# Patient Record
Sex: Female | Born: 1965 | Race: Black or African American | Hispanic: No | Marital: Single | Smoking: Former smoker
Health system: Southern US, Community
[De-identification: ages and names within clinical notes are randomized; demographics above are authoritative.]

## PROBLEM LIST (undated history)

## (undated) DIAGNOSIS — J45909 Unspecified asthma, uncomplicated: Secondary | ICD-10-CM

## (undated) HISTORY — DX: Unspecified asthma, uncomplicated: J45.909

## (undated) HISTORY — PX: TUBAL LIGATION: SHX77

---

## 2018-08-19 ENCOUNTER — Other Ambulatory Visit: Payer: Self-pay | Admitting: Adult Health

## 2018-08-19 ENCOUNTER — Encounter: Payer: Self-pay | Admitting: Adult Health

## 2018-08-19 ENCOUNTER — Encounter (INDEPENDENT_AMBULATORY_CARE_PROVIDER_SITE_OTHER): Payer: Self-pay

## 2018-08-19 ENCOUNTER — Ambulatory Visit (INDEPENDENT_AMBULATORY_CARE_PROVIDER_SITE_OTHER): Payer: Managed Care, Other (non HMO) | Admitting: Adult Health

## 2018-08-19 ENCOUNTER — Other Ambulatory Visit (HOSPITAL_COMMUNITY)
Admission: RE | Admit: 2018-08-19 | Discharge: 2018-08-19 | Disposition: A | Discharge status: Home / Self Care | Payer: Managed Care, Other (non HMO) | Source: Ambulatory Visit | Attending: Adult Health | Admitting: Adult Health

## 2018-08-19 VITALS — BP 107/73 | HR 65 | Ht 67.0 in | Wt 214.0 lb

## 2018-08-19 DIAGNOSIS — Z01419 Encounter for gynecological examination (general) (routine) without abnormal findings: Secondary | ICD-10-CM

## 2018-08-19 DIAGNOSIS — Z1212 Encounter for screening for malignant neoplasm of rectum: Secondary | ICD-10-CM

## 2018-08-19 DIAGNOSIS — R232 Flushing: Secondary | ICD-10-CM

## 2018-08-19 DIAGNOSIS — Z1211 Encounter for screening for malignant neoplasm of colon: Secondary | ICD-10-CM | POA: Insufficient documentation

## 2018-08-19 LAB — HEMOCCULT GUIAC POC 1CARD (OFFICE): Fecal Occult Blood, POC: NEGATIVE

## 2018-08-19 NOTE — Progress Notes (Signed)
Patient ID: Angela Chan, female   DOB: 03-14-1966, 53 y.o.   MRN: [id number] History of Present Illness:  Angela Chan is a 53 year old black female, married in for a well woman gyn exam and pap.She is a new pt.She stopped periods in her 40's and is having hot flashes. She works out 3-4 x weekly.  PCP is Roque Cash FNP  Current Medications, Allergies, Past Medical History, Past Surgical History, Family History and Social History were reviewed in [employer] record.     Review of Systems: Patient denies any headaches, hearing loss, fatigue, blurred vision, shortness of breath, chest pain, abdominal pain, problems with bowel movements(oaccasional constipation), urination, or intercourse(not active). No joint pain or mood swings. +hot flashes    Physical Exam:BP 107/73 (BP Location: Left Arm, Patient Position: Sitting, Cuff Size: Normal)   Pulse 65   Ht 5\' 7"  (1.702 m)   Wt 214 lb (97.1 kg)   BMI 33.52 kg/m  General:  Well developed, well nourished, no acute distress Skin:  Warm and dry Neck:  Midline trachea, normal thyroid, good ROM, no lymphadenopathy Lungs; Clear to auscultation bilaterally Breast:  No dominant palpable mass, retraction, or nipple discharge Cardiovascular: Regular rate and rhythm Abdomen:  Soft, non tender, no hepatosplenomegaly Pelvic:  External genitalia is normal in appearance, no lesions.  The vagina is normal in appearance. Urethra has no lesions or masses. The cervix is bulbous. Pap with HPV performed. Uterus is felt to be normal size, shape, and contour.  No adnexal masses or tenderness noted.Bladder is non tender, no masses felt. Rectal: Good sphincter tone, no polyps, or hemorrhoids felt.  Hemoccult negative. Extremities/musculoskeletal:  No swelling or varicosities noted, no clubbing or cyanosis Psych:  No mood changes, alert and cooperative,seems happy Fall risk is low. PHQ 2 score 0. Examination chaperoned by Marchelle Folks Rash  LPN. Discussed menopause and its symptoms, also HRT and [place] and pycnogenal and she wants to try that.   Impression: 1. Encounter for gynecological examination with Papanicolaou smear of cervix   2. Screening for colorectal cancer   3. Hot flashes       Plan: Physical in 1 year Pap in 3 if normal Get mammogram yearly Colonoscopy per GI Labs with PCP Try pycnogenal 100 mg 1 bid, can order on line

## 2018-08-21 LAB — CYTOLOGY - PAP
Diagnosis: NEGATIVE
HPV: NOT DETECTED

## 2019-02-12 ENCOUNTER — Ambulatory Visit: Payer: Managed Care, Other (non HMO) | Admitting: Family Medicine

## 2019-05-19 ENCOUNTER — Other Ambulatory Visit: Payer: Self-pay

## 2019-05-19 DIAGNOSIS — Z20822 Contact with and (suspected) exposure to covid-19: Secondary | ICD-10-CM

## 2019-05-21 LAB — NOVEL CORONAVIRUS, NAA: SARS-CoV-2, NAA: NOT DETECTED

## 2019-09-10 ENCOUNTER — Other Ambulatory Visit: Payer: Managed Care, Other (non HMO) | Admitting: Adult Health

## 2019-10-29 ENCOUNTER — Other Ambulatory Visit (HOSPITAL_COMMUNITY)
Admission: RE | Admit: 2019-10-29 | Discharge: 2019-10-29 | Disposition: A | Discharge status: Home / Self Care | Payer: Managed Care, Other (non HMO) | Source: Ambulatory Visit | Attending: Adult Health | Admitting: Adult Health

## 2019-10-29 ENCOUNTER — Other Ambulatory Visit: Payer: Self-pay

## 2019-10-29 ENCOUNTER — Encounter: Payer: Self-pay | Admitting: Adult Health

## 2019-10-29 ENCOUNTER — Ambulatory Visit (INDEPENDENT_AMBULATORY_CARE_PROVIDER_SITE_OTHER): Payer: Managed Care, Other (non HMO) | Admitting: Adult Health

## 2019-10-29 VITALS — BP 95/58 | HR 54 | Ht 67.0 in | Wt 195.0 lb

## 2019-10-29 DIAGNOSIS — R61 Generalized hyperhidrosis: Secondary | ICD-10-CM | POA: Diagnosis not present

## 2019-10-29 DIAGNOSIS — Z1272 Encounter for screening for malignant neoplasm of vagina: Secondary | ICD-10-CM

## 2019-10-29 DIAGNOSIS — R232 Flushing: Secondary | ICD-10-CM | POA: Diagnosis not present

## 2019-10-29 DIAGNOSIS — Z01419 Encounter for gynecological examination (general) (routine) without abnormal findings: Secondary | ICD-10-CM | POA: Diagnosis present

## 2019-10-29 DIAGNOSIS — Z114 Encounter for screening for human immunodeficiency virus [HIV]: Secondary | ICD-10-CM

## 2019-10-29 DIAGNOSIS — Z1212 Encounter for screening for malignant neoplasm of rectum: Secondary | ICD-10-CM | POA: Diagnosis not present

## 2019-10-29 DIAGNOSIS — Z1211 Encounter for screening for malignant neoplasm of colon: Secondary | ICD-10-CM

## 2019-10-29 DIAGNOSIS — Z1159 Encounter for screening for other viral diseases: Secondary | ICD-10-CM

## 2019-10-29 DIAGNOSIS — Z780 Asymptomatic menopausal state: Secondary | ICD-10-CM

## 2019-10-29 LAB — HEMOCCULT GUIAC POC 1CARD (OFFICE): Fecal Occult Blood, POC: NEGATIVE

## 2019-10-29 NOTE — Progress Notes (Signed)
Patient ID: Angela Chan, female   DOB: Nov 15, 1965, 54 y.o.   MRN: [id number] History of Present Illness: Angela Chan is a 54 year old black female,married, PM in for well woman gyn exam and pap.Having night sweats.She is active.   Current Medications, Allergies, Past Medical History, Past Surgical History, Family History and Social History were reviewed in [employer] record.     Review of Systems: Patient denies any headaches, hearing loss, fatigue, blurred vision, shortness of breath, chest pain, abdominal pain, problems with  urination, or intercourse. No joint pain or mood swings. No vaginal bleeding +hot flashes and night sweats, she tried pycnogenal without relief  + constipation, doing KETO diet  She has lost 19 lbs since last year  She says had leg cramps last night.  Physical Exam:BP (!) 95/58 (BP Location: Left Arm, Patient Position: Sitting, Cuff Size: Normal)   Pulse (!) 54   Ht 5\' 7"  (1.702 m)   Wt 195 lb (88.5 kg)   BMI 30.54 kg/m  General:  Well developed, well nourished, no acute distress Skin:  Warm and dry Neck:  Midline trachea, normal thyroid, good ROM, no lymphadenopathy Lungs; Clear to auscultation bilaterally Breast:  No dominant palpable mass, retraction, or nipple discharge Cardiovascular: Regular rate and rhythm Abdomen:  Soft, non tender, no hepatosplenomegaly Pelvic:  External genitalia is normal in appearance, no lesions.  The vagina is normal in appearance. Urethra has no lesions or masses. The cervix is bulbous. Pap with High risk HPV 16/18 genotyping performed. Uterus is felt to be normal size, shape, and contour.  No adnexal masses or tenderness noted.Bladder is non tender, no masses felt. Rectal: Good sphincter tone, no polyps, or hemorrhoids felt.  Hemoccult negative. Extremities/musculoskeletal:  No swelling, +varicosities noted, no clubbing or cyanosis Psych:  No mood changes, alert and cooperative,seems happy AA 3 Fall risk  is low PHQ 9 score is 6, no SI Examination chaperoned by LPN  Impression: 1. Encounter for gynecological examination with Papanicolaou smear of cervix Pap sent Physical in 1 year Pap in 3 if normal Get mammogram  Check CBC,CMP,TSH and lipids Can call [place] [facility] her self Try senokot for constipation    2. Screening for colorectal cancer Had colonoscopy  2 years ago in [place] she says   3. Hot flashes Discussed HRT and SSRIs and she declines both Try estroven   4. Postmenopausal  5. Night sweats Try estroven   6. Encounter for hepatitis C screening test for low risk patient Check hepatitis C antibody   7. Screening for HIV without presence of risk factors Check HIV

## 2019-10-30 LAB — COMPREHENSIVE METABOLIC PANEL
ALT: 17 IU/L (ref 0–32)
AST: 21 IU/L (ref 0–40)
Albumin/Globulin Ratio: 1.8 (ref 1.2–2.2)
Albumin: 4.4 g/dL (ref 3.8–4.9)
Alkaline Phosphatase: 43 IU/L (ref 39–117)
BUN/Creatinine Ratio: 22 (ref 9–23)
BUN: 22 mg/dL (ref 6–24)
Bilirubin Total: <0.2 mg/dL (ref 0.0–1.2)
CO2: 24 mmol/L (ref 20–29)
Calcium: 9.8 mg/dL (ref 8.7–10.2)
Chloride: 103 mmol/L (ref 96–106)
Creatinine, Ser: 0.99 mg/dL (ref 0.57–1.00)
GFR calc Af Amer: 75 mL/min/{1.73_m2} (ref >59)
GFR calc non Af Amer: 65 mL/min/{1.73_m2} (ref >59)
Globulin, Total: 2.4 g/dL (ref 1.5–4.5)
Glucose: 85 mg/dL (ref 65–99)
Potassium: 4.5 mmol/L (ref 3.5–5.2)
Sodium: 142 mmol/L (ref 134–144)
Total Protein: 6.8 g/dL (ref 6.0–8.5)

## 2019-10-30 LAB — LIPID PANEL
Chol/HDL Ratio: 3.3 ratio (ref 0.0–4.4)
Cholesterol, Total: 206 mg/dL — ABNORMAL HIGH (ref 100–199)
HDL: 62 mg/dL (ref >39)
LDL Chol Calc (NIH): 132 mg/dL — ABNORMAL HIGH (ref 0–99)
Triglycerides: 66 mg/dL (ref 0–149)
VLDL Cholesterol Cal: 12 mg/dL (ref 5–40)

## 2019-10-30 LAB — HEPATITIS C ANTIBODY: Hep C Virus Ab: <0.1 s/co ratio (ref 0.0–0.9)

## 2019-10-30 LAB — HIV ANTIBODY (ROUTINE TESTING W REFLEX): HIV Screen 4th Generation wRfx: NONREACTIVE

## 2019-10-30 LAB — CBC
Hematocrit: 39.1 % (ref 34.0–46.6)
Hemoglobin: 12.5 g/dL (ref 11.1–15.9)
MCH: 29.5 pg (ref 26.6–33.0)
MCHC: 32 g/dL (ref 31.5–35.7)
MCV: 92 fL (ref 79–97)
Platelets: 198 10*3/uL (ref 150–450)
RBC: 4.24 x10E6/uL (ref 3.77–5.28)
RDW: 13 % (ref 11.7–15.4)
WBC: 4.4 10*3/uL (ref 3.4–10.8)

## 2019-10-30 LAB — TSH: TSH: 1.4 u[IU]/mL (ref 0.450–4.500)

## 2019-11-02 LAB — CYTOLOGY - PAP
Adequacy: ABSENT
Comment: NEGATIVE
Diagnosis: NEGATIVE
High risk HPV: NEGATIVE

## 2020-04-17 ENCOUNTER — Other Ambulatory Visit: Payer: Self-pay

## 2020-04-17 ENCOUNTER — Encounter: Payer: Self-pay | Admitting: Interventional Cardiology

## 2020-04-17 ENCOUNTER — Ambulatory Visit (INDEPENDENT_AMBULATORY_CARE_PROVIDER_SITE_OTHER): Payer: Managed Care, Other (non HMO) | Admitting: Interventional Cardiology

## 2020-04-17 VITALS — BP 106/78 | HR 62 | Ht 67.0 in | Wt 188.0 lb

## 2020-04-17 DIAGNOSIS — R072 Precordial pain: Secondary | ICD-10-CM | POA: Diagnosis not present

## 2020-04-17 DIAGNOSIS — Z8249 Family history of ischemic heart disease and other diseases of the circulatory system: Secondary | ICD-10-CM | POA: Diagnosis not present

## 2020-04-17 MED ORDER — METOPROLOL TARTRATE 25 MG PO TABS: ORAL_TABLET | ORAL | 0 refills | Status: AC

## 2020-04-17 NOTE — Patient Instructions (Signed)
Medication Instructions:  Your physician recommends that you continue on your current medications as directed. Please refer to the Current Medication list given to you today.  *If you need a refill on your cardiac medications before your next appointment, please call your pharmacy*   Lab Work: None  If you have labs (blood work) drawn today and your tests are completely normal, you will receive your results only by:  [place] (if you have MyChart) OR  A paper copy in the mail If you have any lab test that is abnormal or we need to change your treatment, we will call you to review the results.   Testing/Procedures: Your physician has requested that you have cardiac CT.  Follow-Up: Based on test results   Other Instructions Your cardiac CT will be scheduled at one of the below locations:   [facility] [address] [place], [place] [address] [phone]  [place] [address] [place], [place] [address] [phone]  If scheduled at [facility], please arrive at the [facility] main entrance of [facility] 30 minutes prior to test start time. Proceed to the [facility] Radiology Department (first floor) to check-in and test prep.  If scheduled at [facility], please arrive 15 mins early for check-in and test prep.  Please follow these instructions carefully (unless otherwise directed):   On the Night Before the Test:  Be sure to Drink plenty of water.  Do not consume any caffeinated/decaffeinated beverages or chocolate 12 hours prior to your test.  Do not take any antihistamines 12 hours prior to your test.   On the Day of the Test:  Drink plenty of water. Do not drink any water within one hour of the test.  Do not eat any food 4 hours prior to the test.  You may take your regular medications prior to the test.   Take metoprolol (Lopressor)  25 MG two hours prior to test.  FEMALES- please wear underwire-free bra if available     After the Test:  Drink plenty of water.  After receiving IV contrast, you may experience a mild flushed feeling. This is normal.  On occasion, you may experience a mild rash up to 24 hours after the test. This is not dangerous. If this occurs, you can take Benadryl 25 mg and increase your fluid intake.  If you experience trouble breathing, this can be serious. If it is severe call 911 IMMEDIATELY. If it is mild, please call our office.  If you take any of these medications: Glipizide/Metformin, Avandament, Glucavance, please do not take 48 hours after completing test unless otherwise instructed.   Once we have confirmed authorization from your insurance company, we will call you to set up a date and time for your test. Based on how quickly your insurance processes prior authorizations requests, please allow up to 4 weeks to be contacted for scheduling your Cardiac CT appointment. Be advised that routine Cardiac CT appointments could be scheduled as many as 8 weeks after your provider has ordered it.  For non-scheduling related questions, please contact the cardiac imaging nurse navigator should you have any questions/concerns: Marchia Bond, Cardiac Imaging Nurse Navigator Burley Saver, Interim Cardiac Imaging Nurse [facility] Direct Office Dial: [phone]   For scheduling needs, including cancellations and rescheduling, please call Vivien Rota at [phone], option 3.

## 2020-04-17 NOTE — Progress Notes (Signed)
Cardiology Office Note   Date:  04/17/2020   ID:  Angela Chan, DOB [birth year], MRN [id number]  PCP:  Patient, No Pcp Per    No chief complaint on file.  Chest pain  Wt Readings from Last 3 Encounters:  04/17/20 188 lb (85.3 kg)  10/29/19 195 lb (88.5 kg)  08/19/18 214 lb (97.1 kg)       History of Present Illness: Angela Chan is a 54 y.o. female who is being seen today for the evaluation of chest pain at the request of No ref. provider found.    ER records show: she was "complaining of midsternal chest pain that started this morning. Patient states her pain is sharp, nonradiating. Pain is worse with palpation and certain movements. 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 She denies any shortness of breath. She denies any recent travel. Patient has been fully vaccinated against COVID-19."  She describes the feeling like a brick on her chest, and the pain was radiating to the left shoulder.  No unusual dietary issues that day.  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  She went to the ER and had a negative w/u.    Her father is at [facility] hospital now.  He has a prior h/o CABG, in his 71s.  Mother has a pacemaker.    Siblings are healthy.   Walking, elliptical and weights at the gym every other day- no problems with these activities.   She improved her diet and lost weight.  She increased exercise as well.      Past Medical History:  Diagnosis Date  . Asthma     Past Surgical History:  Procedure Laterality Date  . TUBAL LIGATION       Current Outpatient Medications  Medication Sig Dispense Refill  . Multiple Vitamins-Minerals (ONE-A-DAY WOMENS 50+ ADVANTAGE) TABS Take 1 tablet by mouth daily.     . Omega-3 Fatty Acids (FISH OIL) 1000 MG CAPS Take 1 capsule by mouth daily.     . vitamin B-12 (CYANOCOBALAMIN) 500 MCG tablet Take 500 mcg by mouth daily.    Marland Kitchen VITAMIN D,  CHOLECALCIFEROL, PO Take 1 tablet by mouth daily.     . metoprolol tartrate (LOPRESSOR) 25 MG tablet Take 1 tablet by mouth 2 hours prior to Cardiac CT 1 tablet 0   No current facility-administered medications for this visit.    Allergies:   Penicillins    Social History:  The patient  reports that she has quit smoking. She has never used smokeless tobacco. She reports previous alcohol use. She reports that she does not use drugs.   Family History:  The patient's family history includes Alcoholism in her maternal grandfather; Cancer in her maternal grandmother; Colon cancer in her maternal grandmother; Diabetes in her mother; Heart disease in her father; Heart failure in her mother; Hypertension in her mother; Stroke in her father.    ROS:  Please see the history of present illness.   Otherwise, review of systems are positive for occasional ankle edema; tingling in her toes.   All other systems are reviewed and negative.    PHYSICAL EXAM: VS:  BP 106/78   Pulse 62   Ht 5\' 7"  (1.702 m)   Wt 188 lb (85.3 kg)   SpO2 98%   BMI 29.44 kg/m  , BMI Body mass index is 29.44 kg/m. GEN: Well  nourished, well developed, in no acute distress  HEENT: normal  Neck: no JVD, carotid bruits, or masses Cardiac: RRR; no murmurs, rubs, or gallops,no edema  Respiratory:  clear to auscultation bilaterally, normal work of breathing GI: soft, nontender, nondistended, + BS MS: no deformity or atrophy  Skin: warm and dry, no rash Neuro:  Strength and sensation are intact Psych: euthymic mood, full affect   EKG:   The ekg ordered today demonstrates NSR, no ST changes   Recent Labs: 10/29/2019: ALT 17; BUN 22; Creatinine, Ser 0.99; Hemoglobin 12.5; Platelets 198; Potassium 4.5; Sodium 142; TSH 1.400   Lipid Panel    Component Value Date/Time   CHOL 206 (H) 10/29/2019 1213   TRIG 66 10/29/2019 1213   HDL 62 10/29/2019 1213   CHOLHDL 3.3 10/29/2019 1213   LDLCALC 132 (H) 10/29/2019 1213       Other studies Reviewed: Additional studies/ records that were reviewed today with results demonstrating: ER records reviewed.   ASSESSMENT AND PLAN:  1. Precordial chest pain: Some typical features.  Family h/o CAD.  Plan for CTA coronaries.  Metoprolol 25 mg prior to test.   RF modification based on results.   2. No signs of CHF on exam.  3. OK to return to work since she has not had any further sx.    Current medicines are reviewed at length with the patient today.  The patient concerns regarding her medicines were addressed.  The following changes have been made:  No change  Labs/ tests ordered today include:   Orders Placed This Encounter  Procedures  . CT CORONARY MORPH W/CTA COR W/SCORE W/CA W/CM &/OR WO/CM  . CT CORONARY FRACTIONAL FLOW RESERVE DATA PREP  . CT CORONARY FRACTIONAL FLOW RESERVE FLUID ANALYSIS  . EKG 12-Lead    Recommend 150 minutes/week of aerobic exercise Low fat, low carb, high fiber diet recommended  Disposition:   FU based on results,   Signed, Lance Muss, MD  04/17/2020 4:48 PM    [facility] [address] [place], [place], [place]  [address] Phone: [phone]; Fax: [phone]

## 2020-05-03 ENCOUNTER — Ambulatory Visit: Payer: Managed Care, Other (non HMO) | Admitting: Interventional Cardiology

## 2020-06-20 ENCOUNTER — Telehealth (HOSPITAL_COMMUNITY): Payer: Self-pay | Admitting: *Deleted

## 2020-06-20 NOTE — Telephone Encounter (Signed)

## 2020-06-22 ENCOUNTER — Ambulatory Visit (HOSPITAL_COMMUNITY)
Admission: RE | Admit: 2020-06-22 | Discharge: 2020-06-22 | Disposition: A | Discharge status: Home / Self Care | Payer: Managed Care, Other (non HMO) | Source: Ambulatory Visit | Attending: Interventional Cardiology | Admitting: Interventional Cardiology

## 2020-06-22 ENCOUNTER — Other Ambulatory Visit: Payer: Self-pay

## 2020-06-22 DIAGNOSIS — R072 Precordial pain: Secondary | ICD-10-CM

## 2020-06-22 MED ORDER — NITROGLYCERIN 0.4 MG SL SUBL
0.4000 mg | SUBLINGUAL_TABLET | Freq: Once | SUBLINGUAL | Status: AC
  Administered 2020-06-22: 15:00:00 0.4 mg via SUBLINGUAL

## 2020-06-22 MED ORDER — IOHEXOL 350 MG/ML SOLN
80.0000 mL | Freq: Once | INTRAVENOUS | Status: AC | PRN
  Administered 2020-06-22: 80 mL via INTRAVENOUS

## 2020-06-22 MED ORDER — NITROGLYCERIN 0.4 MG SL SUBL
SUBLINGUAL_TABLET | SUBLINGUAL | Status: AC
  Filled 2020-06-22: qty 1

## 2020-06-22 MED ORDER — NITROGLYCERIN 0.4 MG SL SUBL: 0.8000 mg | SUBLINGUAL_TABLET | Freq: Once | SUBLINGUAL | Status: DC

## 2020-11-03 ENCOUNTER — Other Ambulatory Visit: Payer: Managed Care, Other (non HMO) | Admitting: Adult Health

## 2020-12-01 ENCOUNTER — Other Ambulatory Visit: Payer: Self-pay | Admitting: Adult Health

## 2020-12-01 DIAGNOSIS — Z139 Encounter for screening, unspecified: Secondary | ICD-10-CM

## 2020-12-04 ENCOUNTER — Other Ambulatory Visit: Payer: Self-pay

## 2020-12-04 ENCOUNTER — Ambulatory Visit
Admission: RE | Admit: 2020-12-04 | Discharge: 2020-12-04 | Disposition: A | Discharge status: Home / Self Care | Payer: Managed Care, Other (non HMO) | Source: Ambulatory Visit | Attending: Adult Health | Admitting: Adult Health

## 2020-12-04 DIAGNOSIS — Z139 Encounter for screening, unspecified: Secondary | ICD-10-CM

## 2021-09-13 ENCOUNTER — Other Ambulatory Visit: Payer: Self-pay | Admitting: Internal Medicine

## 2021-12-12 ENCOUNTER — Ambulatory Visit
Admission: RE | Admit: 2021-12-12 | Discharge: 2021-12-12 | Disposition: A | Discharge status: Home / Self Care | Payer: 59 | Source: Ambulatory Visit | Attending: Internal Medicine | Admitting: Internal Medicine

## 2021-12-12 DIAGNOSIS — Z1231 Encounter for screening mammogram for malignant neoplasm of breast: Secondary | ICD-10-CM

## 2023-06-02 IMAGING — MG MM DIGITAL SCREENING BILAT W/ TOMO AND CAD
8 series · 9 of 24 positions shown · non-contrast
Comparison: Previous exam(s).

CLINICAL DATA: Screening.

EXAM:
DIGITAL SCREENING BILATERAL MAMMOGRAM WITH TOMOSYNTHESIS AND CAD
TECHNIQUE: Bilateral screening digital craniocaudal and mediolateral oblique
mammograms were obtained. Bilateral screening digital breast
tomosynthesis was performed. The images were evaluated with
computer-aided detection.

[R CC synth-2D]
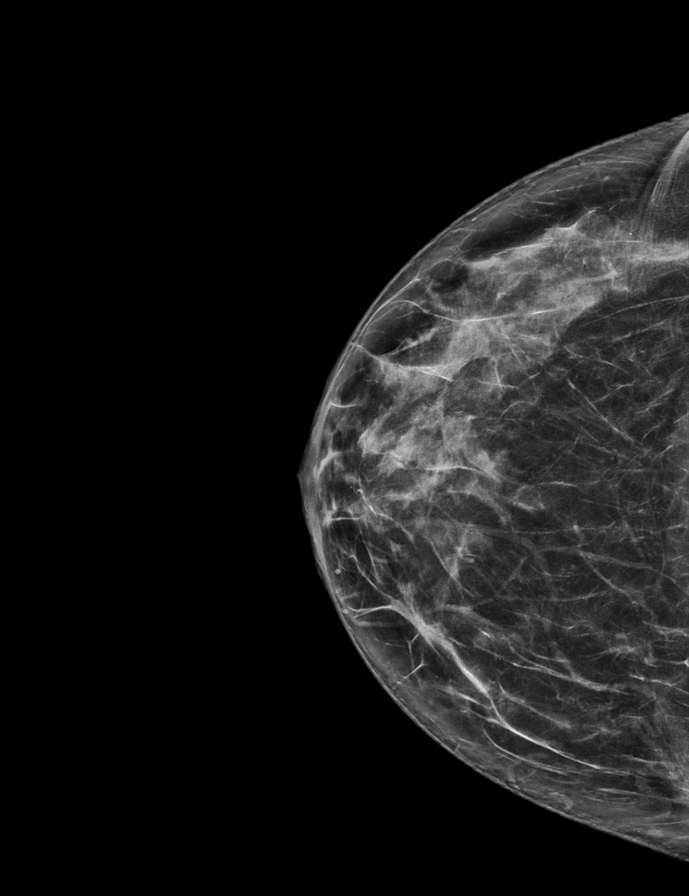

[R MLO synth-2D]
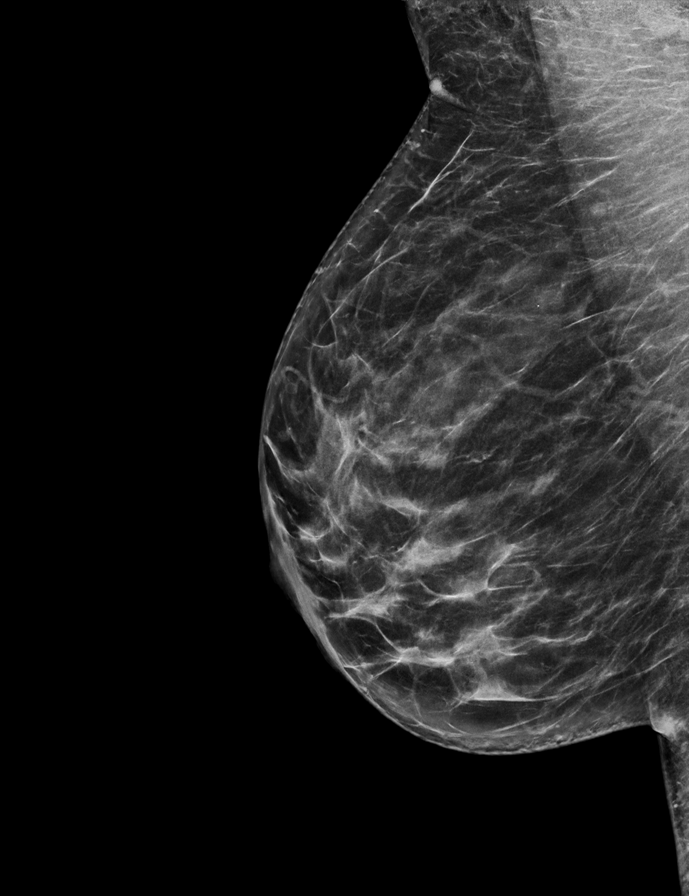

[L CC synth-2D]
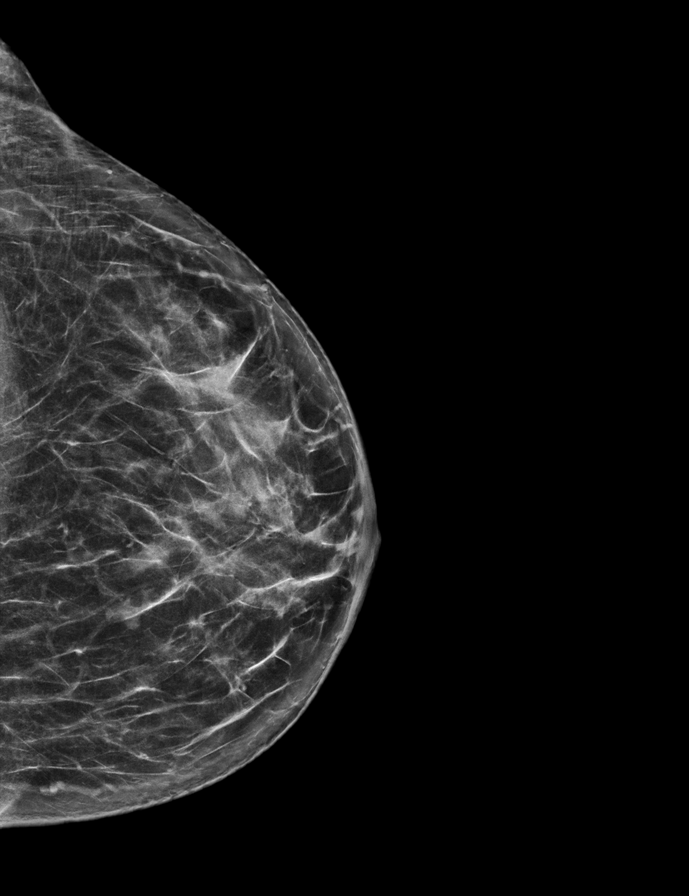

[L MLO synth-2D]
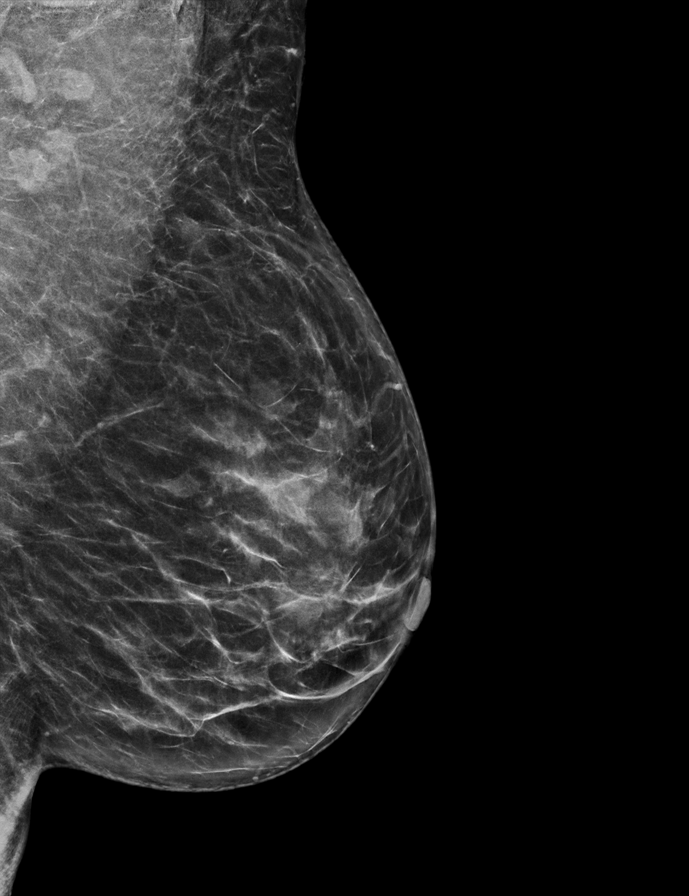

[R CC tomo · 2 of 66 frames shown]
[frame 22/66]
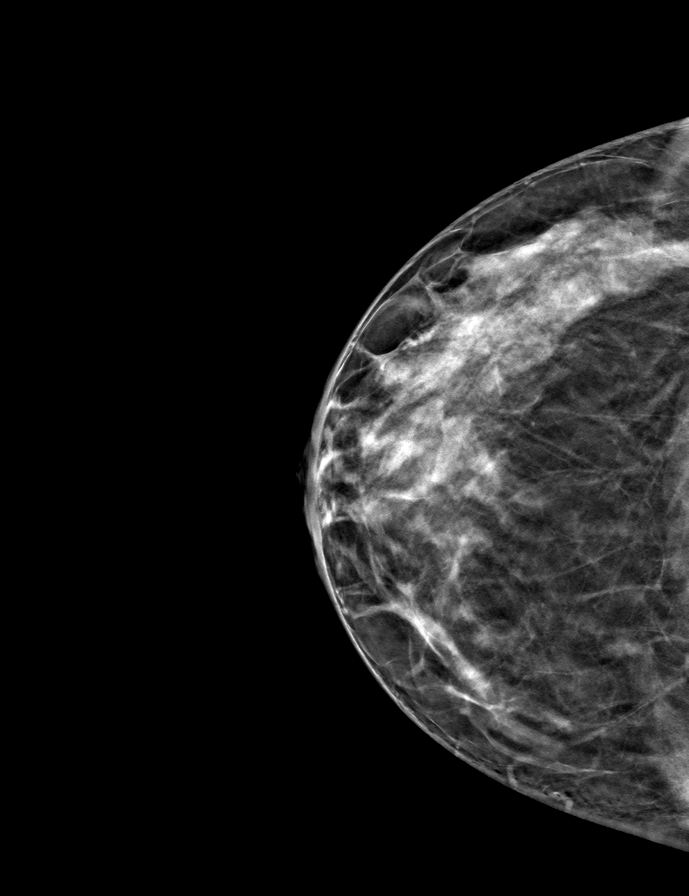
[frame 33/66]
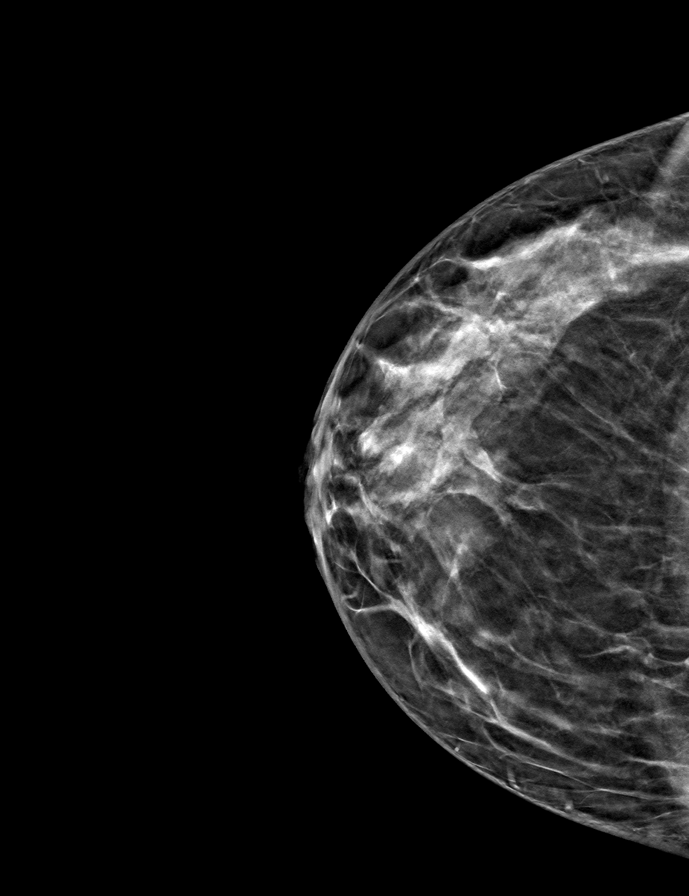

[L CC tomo · tomo slice 33/65.0]
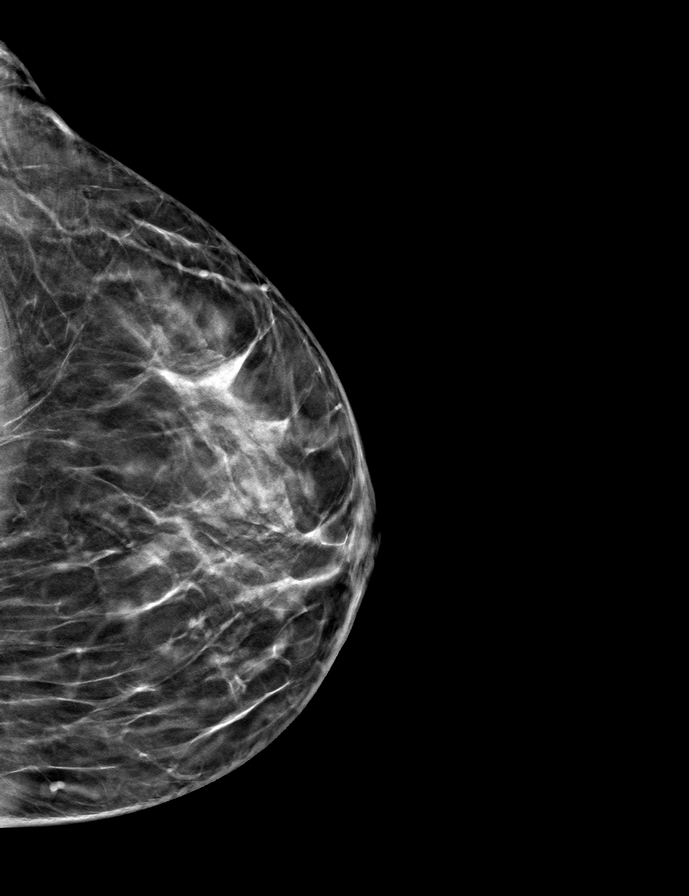

[R MLO tomo · tomo slice 33/66.0]
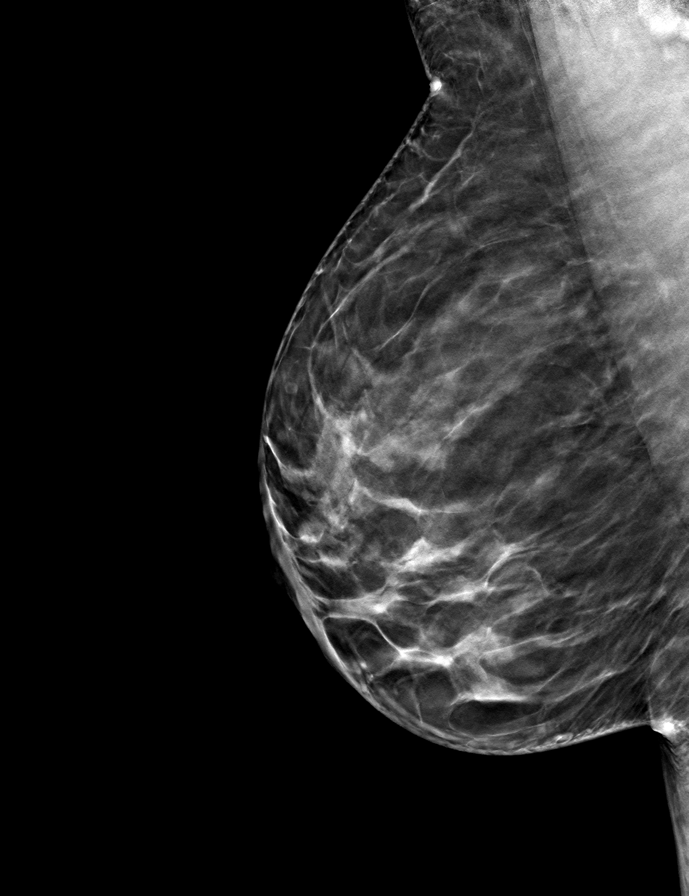

[L MLO tomo · tomo slice 33/66.0]
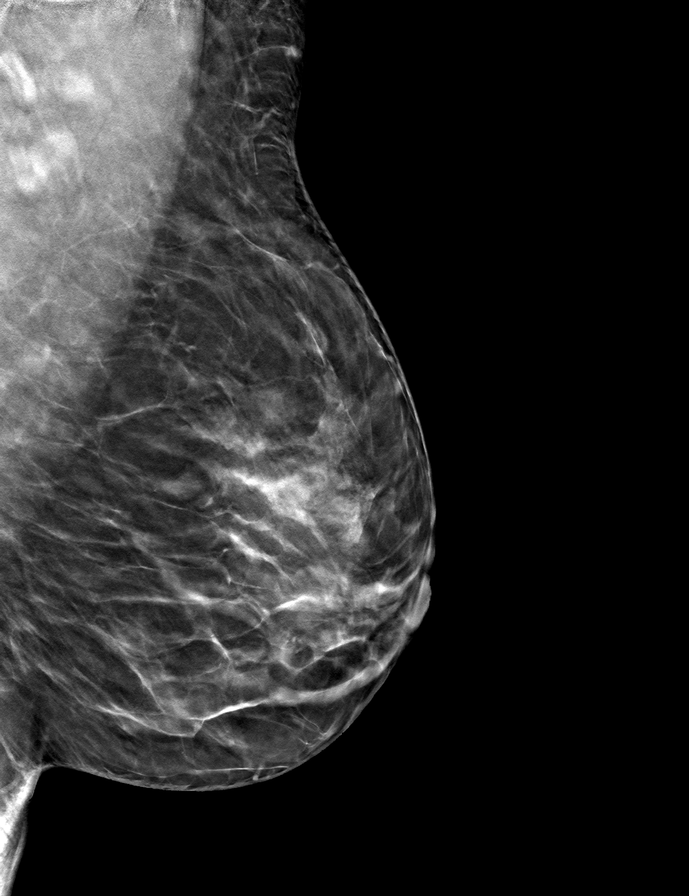

[9 of 24 positions shown; findings below may reference images not displayed]

ACR Breast Density Category c: The breast tissue is heterogeneously
dense, which may obscure small masses.
FINDINGS: There are no findings suspicious for malignancy.
IMPRESSION: No mammographic evidence of malignancy. A result letter of this
screening mammogram will be mailed directly to the patient.

RECOMMENDATION:
Screening mammogram in one year. (Code:Q3-W-BC3)

BI-RADS CATEGORY  1: Negative.
